# Patient Record
Sex: Female | Born: 1975 | Race: White | Hispanic: No | Marital: Married
Health system: Southern US, Community
[De-identification: ages and names within clinical notes are randomized; demographics above are authoritative.]

## PROBLEM LIST (undated history)

## (undated) ENCOUNTER — Ambulatory Visit (HOSPITAL_COMMUNITY): Source: Home / Self Care

---

## 2002-03-03 ENCOUNTER — Other Ambulatory Visit: Admission: RE | Admit: 2002-03-03 | Discharge: 2002-03-03 | Payer: Self-pay | Admitting: Obstetrics and Gynecology

## 2003-05-05 ENCOUNTER — Other Ambulatory Visit: Admission: RE | Admit: 2003-05-05 | Discharge: 2003-05-05 | Payer: Self-pay | Admitting: Obstetrics and Gynecology

## 2004-05-26 ENCOUNTER — Other Ambulatory Visit: Admission: RE | Admit: 2004-05-26 | Discharge: 2004-05-26 | Payer: Self-pay | Admitting: Obstetrics and Gynecology

## 2005-03-20 ENCOUNTER — Other Ambulatory Visit: Admission: RE | Admit: 2005-03-20 | Discharge: 2005-03-20 | Payer: Self-pay | Admitting: Obstetrics and Gynecology

## 2005-08-27 ENCOUNTER — Encounter: Admission: RE | Admit: 2005-08-27 | Discharge: 2005-09-26 | Payer: Self-pay | Admitting: Gynecology

## 2005-11-07 ENCOUNTER — Inpatient Hospital Stay (HOSPITAL_COMMUNITY): Admission: AD | Admit: 2005-11-07 | Discharge: 2005-11-09 | Payer: Self-pay | Admitting: Obstetrics and Gynecology

## 2005-11-10 ENCOUNTER — Encounter: Admission: RE | Admit: 2005-11-10 | Discharge: 2005-12-10 | Payer: Self-pay | Admitting: Gynecology

## 2005-11-13 ENCOUNTER — Ambulatory Visit (HOSPITAL_COMMUNITY): Admission: RE | Admit: 2005-11-13 | Discharge: 2005-11-13 | Payer: Self-pay | Admitting: Gynecology

## 2005-12-11 ENCOUNTER — Encounter: Admission: RE | Admit: 2005-12-11 | Discharge: 2006-01-02 | Payer: Self-pay | Admitting: Gynecology

## 2006-01-02 ENCOUNTER — Other Ambulatory Visit: Admission: RE | Admit: 2006-01-02 | Discharge: 2006-01-02 | Payer: Self-pay | Admitting: Gynecology

## 2007-09-25 IMAGING — CT CT PELVIS W/O CM
1 series · 15 of 32 positions shown, 19 images · IV contrast (agent unspecified)
Comparison: None.

CLINICAL DATA: Patient is approximately 6 days status-post vaginal delivery with painful swelling in the right inguinal region.
 PELVIS CT WITHOUT CONTRAST:
TECHNIQUE: Multidetector CT imaging of the pelvis was performed following the standard protocol without IV contrast.

[Series 2: abd pelvis · axial · 0.70mm/px · z∈[-472,-282]mm · 15 of 43 slices shown, 19 images]
[im 3/43  soft-tissue]
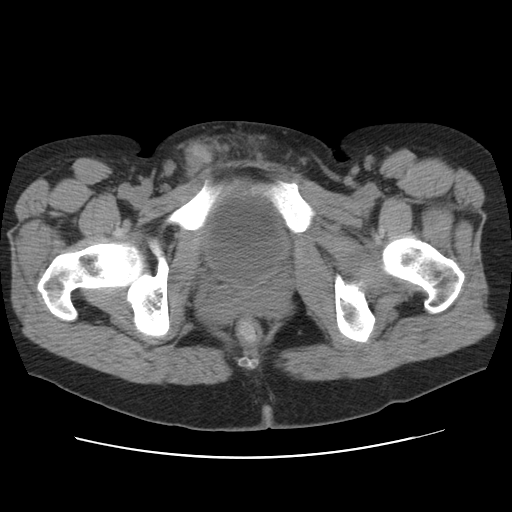
[im 3/43  bone]
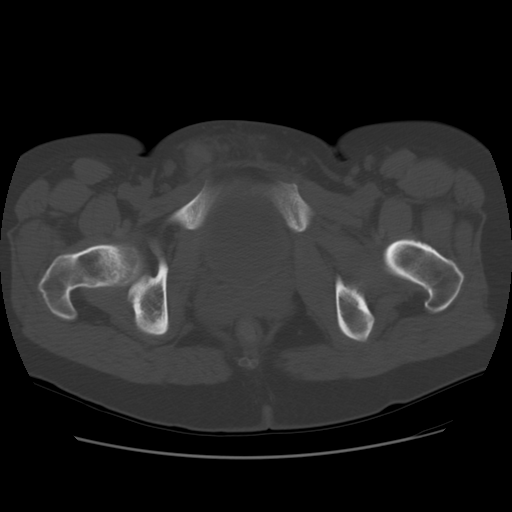
[im 6/43  soft-tissue]
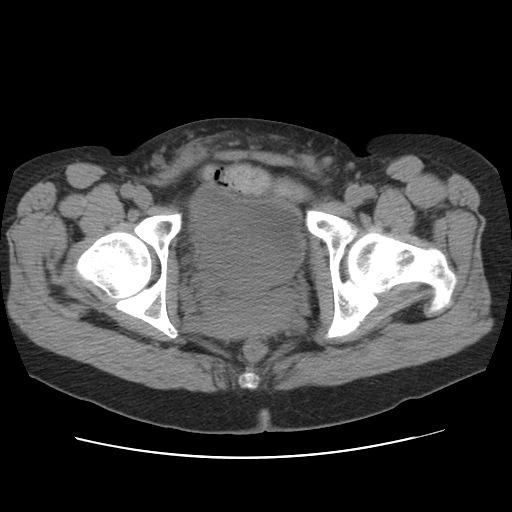
[im 9/43  soft-tissue]
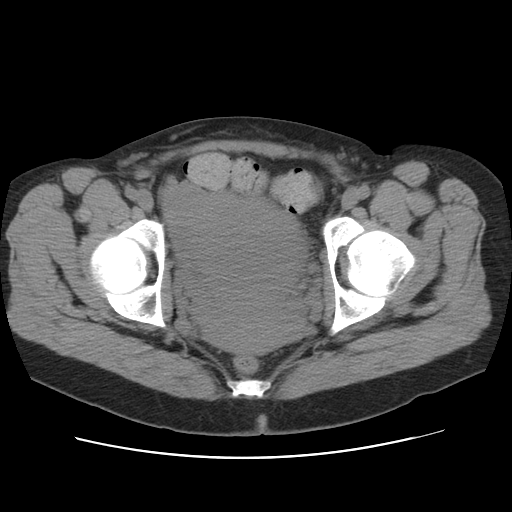
[im 13/43  soft-tissue]
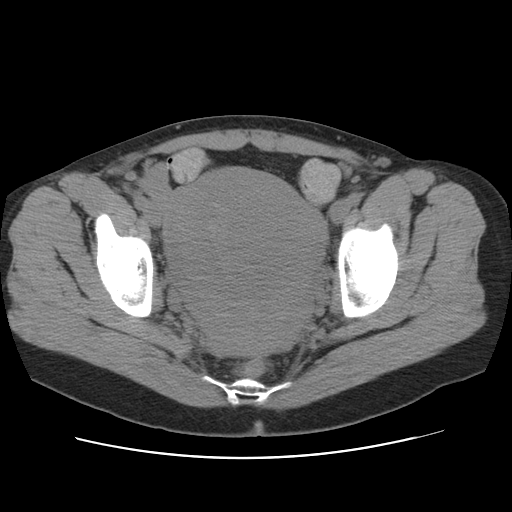
[im 15/43  soft-tissue]
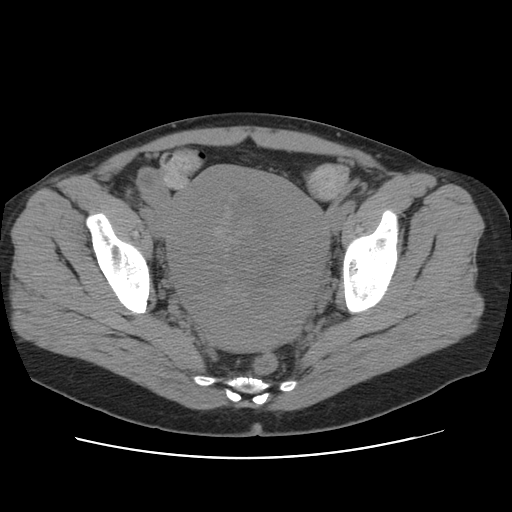
[im 18/43  soft-tissue]
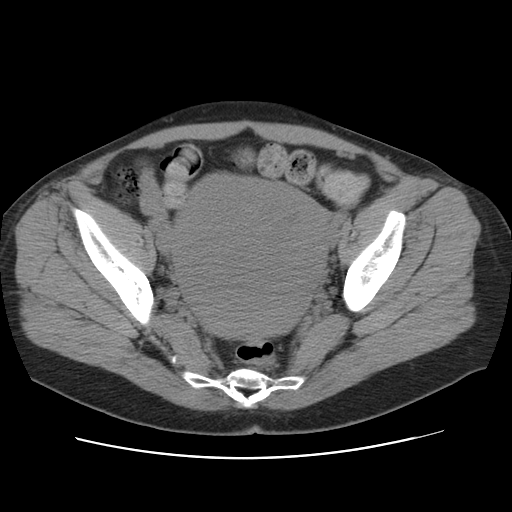
[im 22/43  soft-tissue]
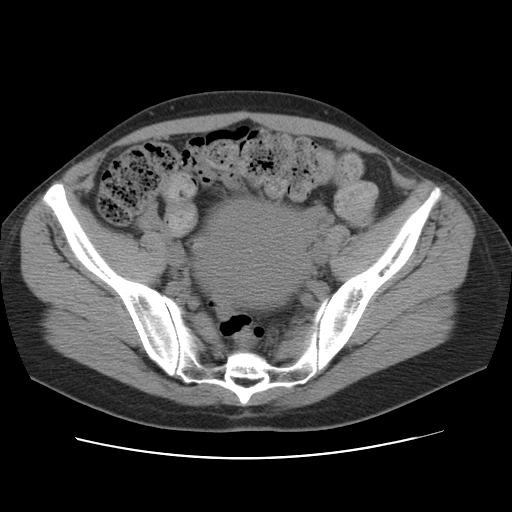
[im 25/43  soft-tissue]
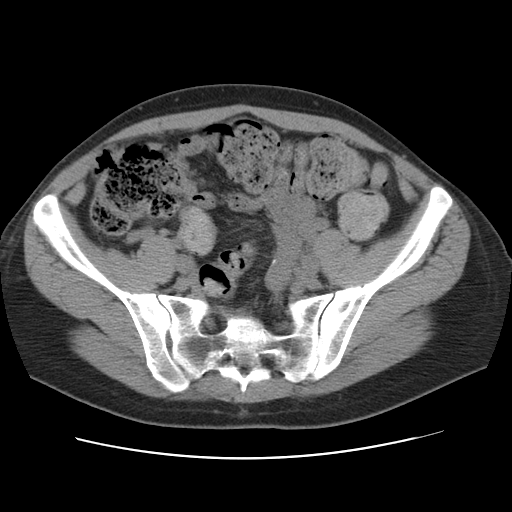
[im 28/43  soft-tissue]
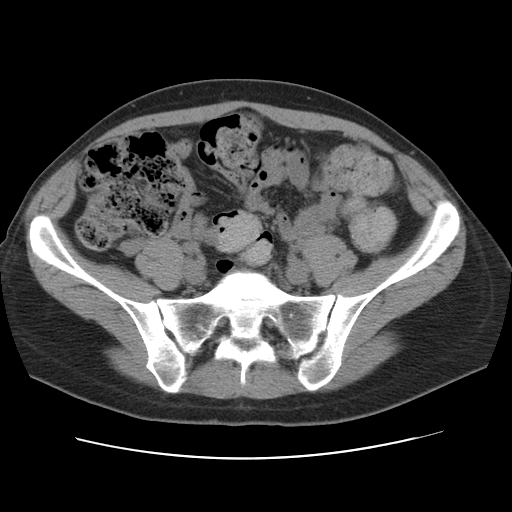
[im 28/43  bone]
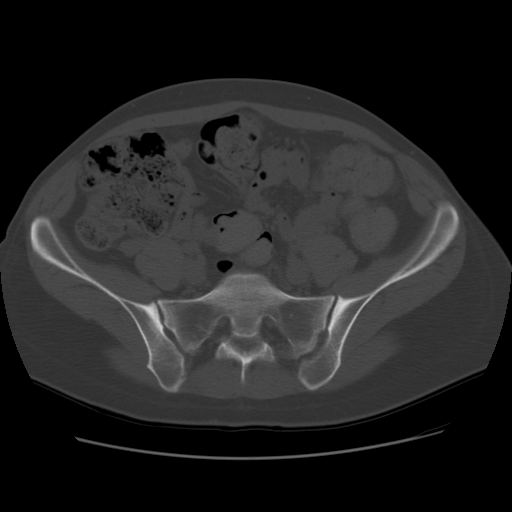
[im 30/43  soft-tissue]
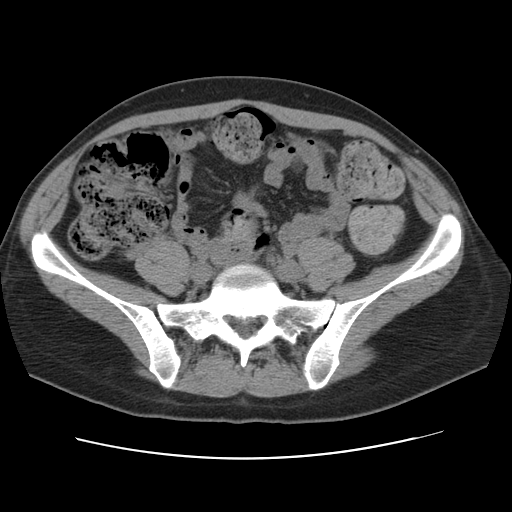
[im 34/43  soft-tissue]
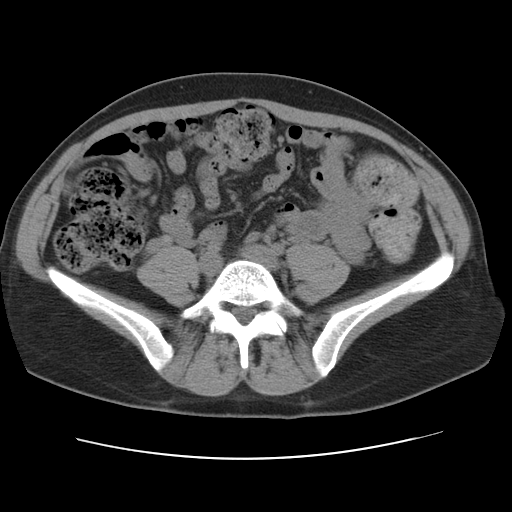
[im 37/43  soft-tissue]
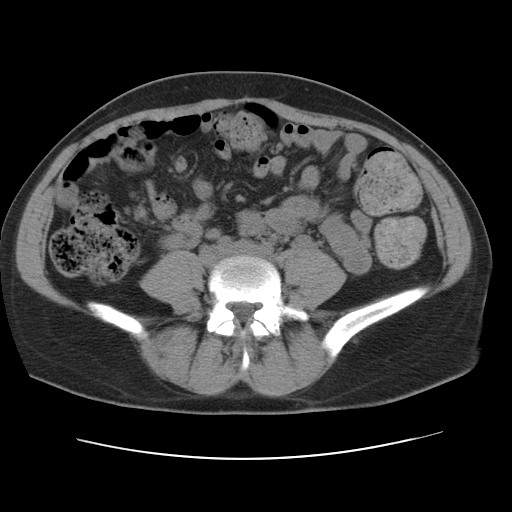
[im 37/43  lung]
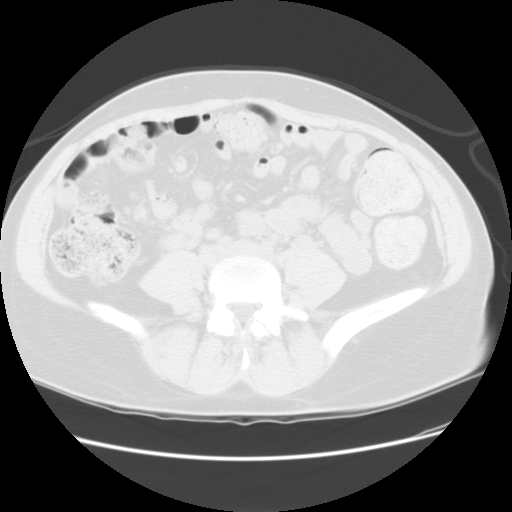
[im 38/43  lung]
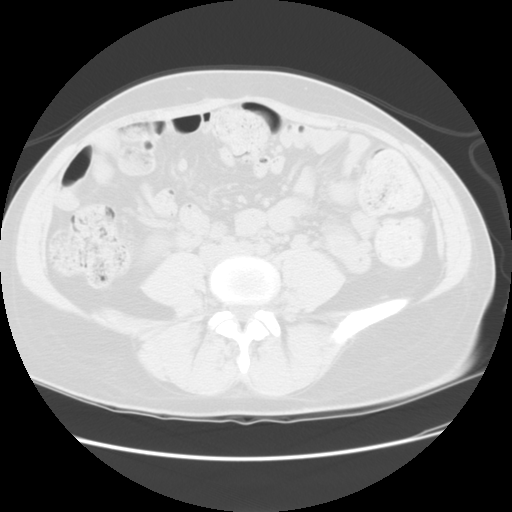
[im 40/43  soft-tissue]
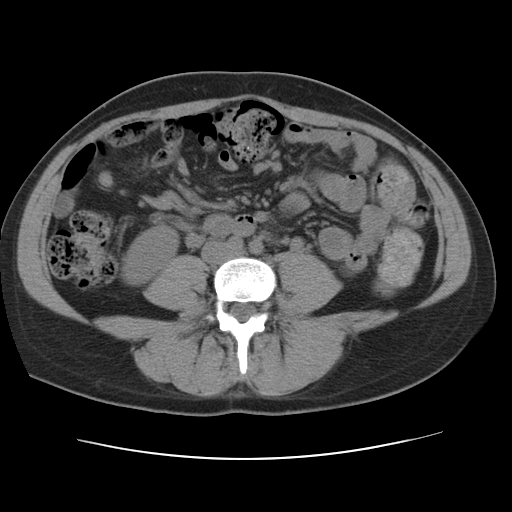
[im 40/43  lung]
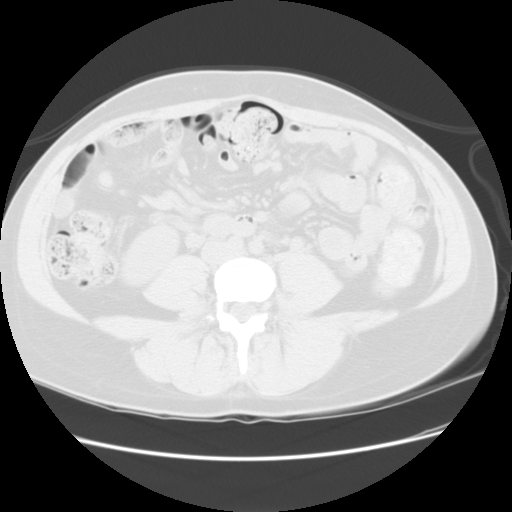
[im 41/43  lung]
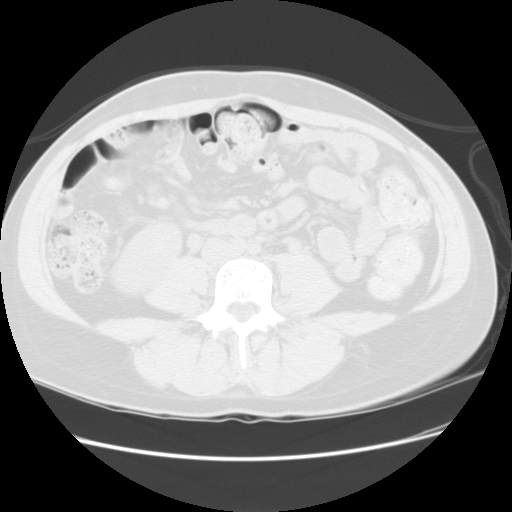

[15 of 32 positions shown; findings below may reference images not displayed]

FINDINGS: The uterus is large, compatible with the history of recent delivery.  High attenuation within the endometrial cavity is compatible with blood products.  There is no free fluid in the peritoneal cavity.  13mm follicle noted in the right ovary.  No evidence for left adnexal mass.
 Abnormal soft tissue attenuation is identified in the right inguinal canal.  This has irregular lobular margins and surrounding mild degree of edema/inflammation.  There is some mild edema within the subcutaneous fat of the incidentally imaged upper labia.
IMPRESSION: 1. Soft tissue attenuation having a lobular contour in the right inguinal canal with adjacent edema/inflammation to a mild degree.  
 2. I spoke to both the patient and Dr. Loredo regarding the case and the patient did have vulvar varicosities during this pregnancy, although they were more prominent on the left than the right.  Imaging features today would be compatible with venous plexus thrombosis within the right inguinal canal, especially in the reported clinical setting of a more chronic ?fullness? in the right inguinal canal which became hard and tender acutely about 3 days ago.  Given the superficiality of the finding, this lesion could be followed clinically and reimaged if there is worsening or progression.  Other differential considerations that might cause this appearance would be endometriosis into the inguinal canal or adenopathy/neoplasm but these are considered unlikely in the clinical setting.

## 2008-01-11 ENCOUNTER — Inpatient Hospital Stay (HOSPITAL_COMMUNITY): Admission: AD | Admit: 2008-01-11 | Discharge: 2008-01-13 | Payer: Self-pay | Admitting: *Deleted

## 2010-09-29 ENCOUNTER — Inpatient Hospital Stay (HOSPITAL_COMMUNITY)
Admission: AD | Admit: 2010-09-29 | Discharge: 2010-09-30 | DRG: 775 | Disposition: A | Discharge status: Home / Self Care | Payer: 59 | Source: Ambulatory Visit | Attending: Obstetrics and Gynecology | Admitting: Obstetrics and Gynecology

## 2010-09-29 DIAGNOSIS — J069 Acute upper respiratory infection, unspecified: Secondary | ICD-10-CM | POA: Diagnosis present

## 2010-09-29 DIAGNOSIS — O99892 Other specified diseases and conditions complicating childbirth: Principal | ICD-10-CM | POA: Diagnosis present

## 2010-09-30 LAB — CBC
Platelets: 219 10*3/uL (ref 150–400)
RBC: 3.87 MIL/uL (ref 3.87–5.11)
RDW: 13.3 % (ref 11.5–15.5)
WBC: 9.7 10*3/uL (ref 4.0–10.5)

## 2010-09-30 LAB — RPR: RPR Ser Ql: NONREACTIVE

## 2010-10-06 NOTE — H&P (Signed)
  NAMETAYTE, Meredith Nelson                 ACCOUNT NO.:  [id number]  MEDICAL RECORD NO.:  [id number]           PATIENT TYPE:  I  LOCATION:  9108                          FACILITY:  [facility]  PHYSICIAN:  Lenoard Aden, M.D.DATE OF BIRTH:  1976-03-09  DATE OF ADMISSION:  09/29/2010 DATE OF DISCHARGE:                             HISTORY & PHYSICAL   ADMISSION DIAGNOSIS:  Labor.  She is a 35 year old white female, G3, P2; EDD February 28, at 38 plus weeks, in active labor.  She has a past medical history remarkable for spontaneous vaginal delivery x2.  Allergies to PENICILLIN.  MEDICATIONS:  Prenatal vitamins.  Nonsmoker, nondrinker.  Denies domestic or physical violence.  She has family history of ovarian cancer.  No other medical or surgical hospitalizations.  PHYSICAL EXAMINATION:  GENERAL:  She is a well-developed, well-nourished white female in a moderate amount of distress. HEENT:  Normal. LUNGS:  Clear. HEART:  Regular rhythm. ABDOMEN:  Soft, gravid, nontender.  Estimated fetal weight 7-1/2 pounds. Cervix is fully dilated, 100%, vertex, +2.  EXTREMITIES:  No cords. NEUROLOGIC:  Nonfocal. SKIN:  Intact.  IMPRESSION:  Term intrauterine pregnancy, in active labor.  PLAN:  Anticipate attempts at vaginal delivery.    Lenoard Aden, M.D.    RJT/MEDQ  D:  09/29/2010  T:  09/30/2010  Job:  [id number]  Electronically Signed by Olivia Mackie M.D. on 10/06/2010 11:14:37 AM

## 2010-10-10 ENCOUNTER — Inpatient Hospital Stay (HOSPITAL_COMMUNITY): Admission: AD | Admit: 2010-10-10 | Payer: Self-pay | Source: Home / Self Care | Admitting: Obstetrics and Gynecology

## 2010-12-26 NOTE — H&P (Signed)
Meredith Nelson, Meredith Nelson                 ACCOUNT NO.:  [id number]   MEDICAL RECORD NO.:  [id number]          PATIENT TYPE:  INP   LOCATION:  9167                          FACILITY:  [facility]   PHYSICIAN:  Lenoard Aden, M.D.DATE OF BIRTH:  [birth year]   DATE OF ADMISSION:  01/11/2008  DATE OF DISCHARGE:                              HISTORY & PHYSICAL   CHIEF COMPLAINT:  Labor.   The patient is a 35 year old white female G2, P1 at 73 weeks' gestation  with spontaneous rupture of membranes on Jan 10, 2008 at 2145 who  presents in active labor.  She is a nonsmoker, nondrinker.  Denies  domestic or physical violence.   ALLERGIES:  PENICILLIN.   MEDICATIONS:  1. Prenatal vitamins.  2. Folic acid.   FAMILY HISTORY:  She has a family history of ovarian cancer.   SOCIAL HISTORY:  She is a nonsmoker.   OBSTETRICAL AND GYNECOLOGY HISTORY:  She has a history of one  uncomplicated vaginal delivery of a 7 pound 7 ounce child complicated by  gestational diabetes at term.  Prenatal course uncomplicated.  She is  GBS negative.   PHYSICAL EXAMINATION:  GENERAL:  She is a well-developed, well-nourished  white female in no acute distress.  HEENT: Normal.  LUNGS:  Clear.  HEART:  Regular rate and rhythm.  ABDOMEN:  Soft, gravid, nontender.  Estimated fetal weight 7 pounds.  PELVIC:  Cervix is 5 cm, 80%, vertex, -1.  EXTREMITIES:  No cords.  NEUROLOGIC:  Nonfocal.  SKIN:  Intact.   IMPRESSION:  1. Term intrauterine pregnancy in active labor.  2. Spontaneous rupture of membranes.   PLAN:  Anticipate attempts at vaginal delivery, epidural as needed.      Lenoard Aden, M.D.  Electronically Signed     RJT/MEDQ  D:  01/11/2008  T:  01/11/2008  Job:  [id number]

## 2010-12-29 NOTE — H&P (Signed)
Meredith Nelson, Meredith Nelson                 ACCOUNT NO.:  [id number]   MEDICAL RECORD NO.:  [id number]          PATIENT TYPE:  INP   LOCATION:  9170                          FACILITY:  [facility]   PHYSICIAN:  Juan H. Lily Peer, M.D.DATE OF BIRTH:  January 14, 1976   DATE OF ADMISSION:  11/07/2005  DATE OF DISCHARGE:                                HISTORY & PHYSICAL   CHIEF COMPLAINT:  Gross rupture of membranes.   HISTORY:  The patient is a 35 year old gravida 1, para 0 who is currently 9-  weeks gestation with a due date of November 12, 2005. The patient is a  gestational diabetic controlled by diet. She presented to the office today  complaining of gross rupture of membranes at around 8 a.m. this morning. The  patient had transferred to our practice at 28-weeks gestation. The remainder  of her prenatal course, with the exception of being gestational diabetic,  was uneventful.   PAST MEDICAL HISTORY:  1.  The patient has had a history of a LEEP cervical conization in 1999.  2.  Gestation diabetes with this pregnancy.   ALLERGIES:  PENICILLIN (See [place] form).   PHYSICAL EXAMINATION:  VITAL SIGNS:  Blood pressure 120/70, weight 179-1/2  pounds.  HEENT:  Unremarkable.  NECK:  Supple, trachea midline. No carotid bruits, no thyromegaly.  LUNGS:  Clear to auscultation without rhonchi or wheezes.  HEART:  Regular rate and rhythm, no murmurs or gallops.  BREASTS:  Exam not done.  ABDOMEN:  Gravid uterus, vertex presentation. By Thayer Ohm maneuver fundal  height 38 cm.  PELVIC EXAM:  Gross rupture of membranes. Cervix 1 cm, 90% effaced, -3  station.  EXTREMITIES:  Deep tendon reflexes 1+, negative clonus.   PRENATAL LABS:  A positive blood type, negative antibody screen, VDRL was  nonreactive, hepatitis B surface antigen and HIV were negative. Patient had  a first trimester screen which was reported to be normal and GBS culture was  negative. Diabetes screen abnormal.   ASSESSMENT:  35 year old  gravida 1, para 0 at [redacted] weeks gestation. A  gestation diabetic, well controlled on diet. Gross rupture of membranes this  morning, in early labor. Cervix 1 cm, 80-90% effaced, -3 station. Patient is  GBS negative. Will admit to labor and delivery for planned delivery and  augment with pitocin in the event of protracted labor.   PLAN:  As per assessment above.      Juan H. Lily Peer, M.D.  Electronically Signed     JHF/MEDQ  D:  11/07/2005  T:  11/07/2005  Job:  [id number]

## 2011-05-09 LAB — CBC
HCT: 32.5 — ABNORMAL LOW
MCHC: 36
MCV: 89.5
Platelets: 223
RDW: 13.3
WBC: 8.8

## 2011-05-10 LAB — CBC
MCHC: 35
Platelets: 205
RBC: 3.34 — ABNORMAL LOW

## 2021-04-25 DIAGNOSIS — L821 Other seborrheic keratosis: Secondary | ICD-10-CM | POA: Diagnosis not present

## 2021-04-25 DIAGNOSIS — D225 Melanocytic nevi of trunk: Secondary | ICD-10-CM | POA: Diagnosis not present

## 2021-04-25 DIAGNOSIS — L718 Other rosacea: Secondary | ICD-10-CM | POA: Diagnosis not present

## 2021-04-25 DIAGNOSIS — D2271 Melanocytic nevi of right lower limb, including hip: Secondary | ICD-10-CM | POA: Diagnosis not present

## 2021-04-25 DIAGNOSIS — D2272 Melanocytic nevi of left lower limb, including hip: Secondary | ICD-10-CM | POA: Diagnosis not present

## 2021-06-22 ENCOUNTER — Telehealth: Payer: Self-pay | Admitting: *Deleted

## 2021-06-22 NOTE — Telephone Encounter (Signed)
Per physician, patient needs referral to Dr Christia Reading for possible foreign body.

## 2021-06-23 DIAGNOSIS — K219 Gastro-esophageal reflux disease without esophagitis: Secondary | ICD-10-CM | POA: Diagnosis not present

## 2021-06-23 DIAGNOSIS — J3489 Other specified disorders of nose and nasal sinuses: Secondary | ICD-10-CM | POA: Diagnosis not present

## 2021-06-23 NOTE — Telephone Encounter (Signed)
Patient is scheduled to see [facility] on 06/23/21

## 2021-12-15 DIAGNOSIS — R7989 Other specified abnormal findings of blood chemistry: Secondary | ICD-10-CM | POA: Diagnosis not present

## 2021-12-15 DIAGNOSIS — R946 Abnormal results of thyroid function studies: Secondary | ICD-10-CM | POA: Diagnosis not present

## 2021-12-22 DIAGNOSIS — R635 Abnormal weight gain: Secondary | ICD-10-CM | POA: Diagnosis not present

## 2021-12-22 DIAGNOSIS — R748 Abnormal levels of other serum enzymes: Secondary | ICD-10-CM | POA: Diagnosis not present

## 2021-12-22 DIAGNOSIS — L749 Eccrine sweat disorder, unspecified: Secondary | ICD-10-CM | POA: Diagnosis not present

## 2021-12-22 DIAGNOSIS — R7989 Other specified abnormal findings of blood chemistry: Secondary | ICD-10-CM | POA: Diagnosis not present

## 2021-12-22 DIAGNOSIS — Z0000 Encounter for general adult medical examination without abnormal findings: Secondary | ICD-10-CM | POA: Diagnosis not present

## 2021-12-22 DIAGNOSIS — R350 Frequency of micturition: Secondary | ICD-10-CM | POA: Diagnosis not present

## 2021-12-22 DIAGNOSIS — R232 Flushing: Secondary | ICD-10-CM | POA: Diagnosis not present

## 2021-12-22 DIAGNOSIS — J309 Allergic rhinitis, unspecified: Secondary | ICD-10-CM | POA: Diagnosis not present

## 2022-04-30 DIAGNOSIS — D2272 Melanocytic nevi of left lower limb, including hip: Secondary | ICD-10-CM | POA: Diagnosis not present

## 2022-04-30 DIAGNOSIS — L718 Other rosacea: Secondary | ICD-10-CM | POA: Diagnosis not present

## 2022-04-30 DIAGNOSIS — D2271 Melanocytic nevi of right lower limb, including hip: Secondary | ICD-10-CM | POA: Diagnosis not present

## 2022-04-30 DIAGNOSIS — L570 Actinic keratosis: Secondary | ICD-10-CM | POA: Diagnosis not present

## 2022-04-30 DIAGNOSIS — L812 Freckles: Secondary | ICD-10-CM | POA: Diagnosis not present

## 2022-04-30 DIAGNOSIS — L821 Other seborrheic keratosis: Secondary | ICD-10-CM | POA: Diagnosis not present

## 2022-04-30 DIAGNOSIS — D225 Melanocytic nevi of trunk: Secondary | ICD-10-CM | POA: Diagnosis not present

## 2023-01-08 DIAGNOSIS — Z0000 Encounter for general adult medical examination without abnormal findings: Secondary | ICD-10-CM | POA: Diagnosis not present

## 2023-01-08 DIAGNOSIS — R946 Abnormal results of thyroid function studies: Secondary | ICD-10-CM | POA: Diagnosis not present

## 2023-01-10 DIAGNOSIS — R82998 Other abnormal findings in urine: Secondary | ICD-10-CM | POA: Diagnosis not present

## 2023-01-10 DIAGNOSIS — J309 Allergic rhinitis, unspecified: Secondary | ICD-10-CM | POA: Diagnosis not present

## 2023-01-10 DIAGNOSIS — Z0000 Encounter for general adult medical examination without abnormal findings: Secondary | ICD-10-CM | POA: Diagnosis not present

## 2023-01-10 DIAGNOSIS — R946 Abnormal results of thyroid function studies: Secondary | ICD-10-CM | POA: Diagnosis not present

## 2023-01-10 DIAGNOSIS — Z1339 Encounter for screening examination for other mental health and behavioral disorders: Secondary | ICD-10-CM | POA: Diagnosis not present

## 2023-02-06 DIAGNOSIS — Z1211 Encounter for screening for malignant neoplasm of colon: Secondary | ICD-10-CM | POA: Diagnosis not present

## 2023-07-26 DIAGNOSIS — D2272 Melanocytic nevi of left lower limb, including hip: Secondary | ICD-10-CM | POA: Diagnosis not present

## 2023-07-26 DIAGNOSIS — L821 Other seborrheic keratosis: Secondary | ICD-10-CM | POA: Diagnosis not present

## 2023-07-26 DIAGNOSIS — L814 Other melanin hyperpigmentation: Secondary | ICD-10-CM | POA: Diagnosis not present

## 2023-07-26 DIAGNOSIS — D2271 Melanocytic nevi of right lower limb, including hip: Secondary | ICD-10-CM | POA: Diagnosis not present

## 2023-07-26 DIAGNOSIS — D225 Melanocytic nevi of trunk: Secondary | ICD-10-CM | POA: Diagnosis not present

## 2024-03-21 DIAGNOSIS — M79671 Pain in right foot: Secondary | ICD-10-CM | POA: Diagnosis not present

## 2024-03-21 DIAGNOSIS — S90861A Insect bite (nonvenomous), right foot, initial encounter: Secondary | ICD-10-CM | POA: Diagnosis not present

## 2024-03-21 DIAGNOSIS — L03115 Cellulitis of right lower limb: Secondary | ICD-10-CM | POA: Diagnosis not present

## 2024-03-21 DIAGNOSIS — W57XXXA Bitten or stung by nonvenomous insect and other nonvenomous arthropods, initial encounter: Secondary | ICD-10-CM | POA: Diagnosis not present

## 2024-03-21 DIAGNOSIS — L299 Pruritus, unspecified: Secondary | ICD-10-CM | POA: Diagnosis not present

## 2024-04-21 DIAGNOSIS — L738 Other specified follicular disorders: Secondary | ICD-10-CM | POA: Diagnosis not present

## 2024-04-22 DIAGNOSIS — R946 Abnormal results of thyroid function studies: Secondary | ICD-10-CM | POA: Diagnosis not present

## 2024-04-22 DIAGNOSIS — R7989 Other specified abnormal findings of blood chemistry: Secondary | ICD-10-CM | POA: Diagnosis not present

## 2024-04-22 DIAGNOSIS — Z1212 Encounter for screening for malignant neoplasm of rectum: Secondary | ICD-10-CM | POA: Diagnosis not present

## 2024-04-22 DIAGNOSIS — Z0000 Encounter for general adult medical examination without abnormal findings: Secondary | ICD-10-CM | POA: Diagnosis not present

## 2024-04-30 DIAGNOSIS — Z0000 Encounter for general adult medical examination without abnormal findings: Secondary | ICD-10-CM | POA: Diagnosis not present

## 2024-04-30 DIAGNOSIS — Z1331 Encounter for screening for depression: Secondary | ICD-10-CM | POA: Diagnosis not present

## 2024-04-30 DIAGNOSIS — R82998 Other abnormal findings in urine: Secondary | ICD-10-CM | POA: Diagnosis not present

## 2024-04-30 DIAGNOSIS — J309 Allergic rhinitis, unspecified: Secondary | ICD-10-CM | POA: Diagnosis not present
